# Patient Record
Sex: Female | Born: 1955 | Race: Asian | Hispanic: No | Marital: Married | Smoking: Never smoker
Health system: Southern US, Community
[De-identification: ages and names within clinical notes are randomized; demographics above are authoritative.]

## PROBLEM LIST (undated history)

## (undated) DIAGNOSIS — I10 Essential (primary) hypertension: Secondary | ICD-10-CM

## (undated) DIAGNOSIS — E119 Type 2 diabetes mellitus without complications: Secondary | ICD-10-CM

---

## 1999-02-28 ENCOUNTER — Other Ambulatory Visit: Admission: RE | Admit: 1999-02-28 | Discharge: 1999-02-28 | Payer: Self-pay | Admitting: Family Medicine

## 1999-03-06 ENCOUNTER — Encounter: Admission: RE | Admit: 1999-03-06 | Discharge: 1999-03-06 | Payer: Self-pay | Admitting: Obstetrics & Gynecology

## 1999-03-23 ENCOUNTER — Ambulatory Visit (HOSPITAL_COMMUNITY): Admission: RE | Admit: 1999-03-23 | Discharge: 1999-03-23 | Payer: Self-pay | Admitting: Obstetrics & Gynecology

## 2007-02-10 ENCOUNTER — Ambulatory Visit (HOSPITAL_BASED_OUTPATIENT_CLINIC_OR_DEPARTMENT_OTHER): Admission: RE | Admit: 2007-02-10 | Discharge: 2007-02-10 | Payer: Self-pay | Admitting: Orthopedic Surgery

## 2007-02-10 ENCOUNTER — Encounter (INDEPENDENT_AMBULATORY_CARE_PROVIDER_SITE_OTHER): Payer: Self-pay | Admitting: Orthopedic Surgery

## 2011-01-01 NOTE — Op Note (Signed)
NAME:  Patricia Petty, Patricia Petty                      ACCOUNT NO.:  [id number]   MEDICAL RECORD NO.:  [id number]          PATIENT TYPE:  AMB   LOCATION:  DSC                          FACILITY:  [facility]   PHYSICIAN:  Cindee Salt, M.D.       DATE OF BIRTH:  06-Jun-1956   DATE OF PROCEDURE:  02/10/2007  DATE OF DISCHARGE:                               OPERATIVE REPORT   PREOPERATIVE DIAGNOSIS:  Cyst of first dorsal compartment, left wrist,  with de Quervain's.   POSTOPERATIVE DIAGNOSIS:  Cyst of first dorsal compartment, left wrist,  with de Quervain's.   OPERATION:  Release of first dorsal compartment and excision of first  dorsal compartment cyst, left wrist.   SURGEON:  Cindee Salt, M.D.   ASSISTANT:  None.   ANESTHESIA:  Forearm-based IV regional.   HISTORY:  The patient is a 55 year old female with a history of large  mass on the radial aspect of her wrist with positive Finkelstein test.  She is desirous of release and excision of the mass.  She is aware of  risks and complications including infection, recurrence, injury to  arteries, nerves and tendons, incomplete relief of symptoms, dystrophy,  possibility of irritation or injury to the radial nerve with numbness  and tingling distally.  She is desirous of removal.  Questions were  encouraged and answered.  In the preoperative area, the extremity was  marked by both patient and surgeon with an interpreter.  Questions again  answered for her to her satisfaction and antibiotic given.   PROCEDURE:  The patient was brought to the operating room where a  forearm-based IV regional anesthetic was carried out without difficulty.  She was prepped using Betadine scrubbing solution with the left arm  free.  An oblique incision was made in line with the tendons and carried  down through subcutaneous tissue.  Bleeders were electrocauterized.  Radial nerve was identified and protected.  A large cystic mass was  immediately apparent, occupying the central  portion of the 1st dorsal  compartment.  The extensor pollicis brevis tendon was identified.  To  the dorsal aspect of the 1st dorsal compartment, incision was made.  The  septum was present; this was excised.  A release was performed.  A  significant synovitis was present and synovectomy performed.  The APL  was also identified and synovectomy performed of this.  The cyst was  then excised, taking care to preserve the retinaculum; this was sent to  Pathology.  The wound was irrigated.  The thumb lay in good position  with the tendons within the sheath.  The sheath was maintained volarly.  The skin was then closed with interrupted 4-0 Vicryl Rapide sutures.  A  sterile compressive dressing and thumb spica splint were applied.  The  patient tolerated the procedure well and was taken to the recovery room  for observation in satisfactory condition.  She will be discharged home  to return to the [facility] [place] in 1 week on Vicodin.  ______________________________  Cindee Salt, M.D.    GK/MEDQ  D:  02/10/2007  T:  02/11/2007  Job:  [id number]

## 2011-06-05 LAB — POCT HEMOGLOBIN-HEMACUE
Hemoglobin: 16.2 — ABNORMAL HIGH
Operator id: 116011

## 2017-05-18 ENCOUNTER — Encounter (HOSPITAL_COMMUNITY): Payer: Self-pay | Admitting: Emergency Medicine

## 2017-05-18 DIAGNOSIS — R1031 Right lower quadrant pain: Secondary | ICD-10-CM | POA: Diagnosis not present

## 2017-05-18 DIAGNOSIS — N10 Acute tubulo-interstitial nephritis: Secondary | ICD-10-CM | POA: Insufficient documentation

## 2017-05-18 DIAGNOSIS — E119 Type 2 diabetes mellitus without complications: Secondary | ICD-10-CM | POA: Diagnosis not present

## 2017-05-18 DIAGNOSIS — I10 Essential (primary) hypertension: Secondary | ICD-10-CM | POA: Insufficient documentation

## 2017-05-18 DIAGNOSIS — Z79899 Other long term (current) drug therapy: Secondary | ICD-10-CM | POA: Insufficient documentation

## 2017-05-18 DIAGNOSIS — Z7984 Long term (current) use of oral hypoglycemic drugs: Secondary | ICD-10-CM | POA: Diagnosis not present

## 2017-05-18 DIAGNOSIS — R112 Nausea with vomiting, unspecified: Secondary | ICD-10-CM | POA: Diagnosis present

## 2017-05-18 LAB — CBC
HEMATOCRIT: 39 % (ref 36.0–46.0)
Hemoglobin: 13.3 g/dL (ref 12.0–15.0)
MCH: 30.5 pg (ref 26.0–34.0)
MCHC: 34.1 g/dL (ref 30.0–36.0)
MCV: 89.4 fL (ref 78.0–100.0)
Platelets: 258 10*3/uL (ref 150–400)
RBC: 4.36 MIL/uL (ref 3.87–5.11)
RDW: 13.2 % (ref 11.5–15.5)
WBC: 7.9 10*3/uL (ref 4.0–10.5)

## 2017-05-18 LAB — COMPREHENSIVE METABOLIC PANEL
ALT: 35 U/L (ref 14–54)
ANION GAP: 8 (ref 5–15)
AST: 36 U/L (ref 15–41)
Albumin: 4 g/dL (ref 3.5–5.0)
Alkaline Phosphatase: 72 U/L (ref 38–126)
BUN: 7 mg/dL (ref 6–20)
CO2: 25 mmol/L (ref 22–32)
Calcium: 9.1 mg/dL (ref 8.9–10.3)
Chloride: 106 mmol/L (ref 101–111)
Creatinine, Ser: 0.76 mg/dL (ref 0.44–1.00)
Glucose, Bld: 112 mg/dL — ABNORMAL HIGH (ref 65–99)
POTASSIUM: 3.5 mmol/L (ref 3.5–5.1)
Sodium: 139 mmol/L (ref 135–145)
TOTAL PROTEIN: 7.2 g/dL (ref 6.5–8.1)
Total Bilirubin: 1.8 mg/dL — ABNORMAL HIGH (ref 0.3–1.2)

## 2017-05-18 LAB — LIPASE, BLOOD: Lipase: 32 U/L (ref 11–51)

## 2017-05-18 NOTE — ED Triage Notes (Signed)
BIB EMS from home for N/V/D. Pt speaks vietnamese. 22 ga IV left hand.  EMS VS 104/56, HR 89, CBG 128, RR 22.

## 2017-05-19 ENCOUNTER — Emergency Department (HOSPITAL_COMMUNITY)
Admission: EM | Admit: 2017-05-19 | Discharge: 2017-05-19 | Disposition: A | Discharge status: Home / Self Care | Payer: BLUE CROSS/BLUE SHIELD | Attending: Emergency Medicine | Admitting: Emergency Medicine

## 2017-05-19 ENCOUNTER — Encounter (HOSPITAL_COMMUNITY): Payer: Self-pay | Admitting: Radiology

## 2017-05-19 ENCOUNTER — Emergency Department (HOSPITAL_COMMUNITY): Payer: BLUE CROSS/BLUE SHIELD

## 2017-05-19 DIAGNOSIS — N12 Tubulo-interstitial nephritis, not specified as acute or chronic: Secondary | ICD-10-CM

## 2017-05-19 HISTORY — DX: Essential (primary) hypertension: I10

## 2017-05-19 HISTORY — DX: Type 2 diabetes mellitus without complications: E11.9

## 2017-05-19 LAB — URINALYSIS, ROUTINE W REFLEX MICROSCOPIC
BILIRUBIN URINE: NEGATIVE
Glucose, UA: NEGATIVE mg/dL
Hgb urine dipstick: NEGATIVE
KETONES UR: NEGATIVE mg/dL
Nitrite: NEGATIVE
PROTEIN: NEGATIVE mg/dL
Specific Gravity, Urine: 1.012 (ref 1.005–1.030)
pH: 6 (ref 5.0–8.0)

## 2017-05-19 LAB — CBG MONITORING, ED: Glucose-Capillary: 125 mg/dL — ABNORMAL HIGH (ref 65–99)

## 2017-05-19 MED ORDER — ONDANSETRON HCL 4 MG/2ML IJ SOLN
4.0000 mg | Freq: Once | INTRAMUSCULAR | Status: AC
  Administered 2017-05-19: 4 mg via INTRAVENOUS
  Filled 2017-05-19: qty 2

## 2017-05-19 MED ORDER — CEPHALEXIN 500 MG PO CAPS: 500.0000 mg | ORAL_CAPSULE | Freq: Three times a day (TID) | ORAL | 0 refills | Status: DC

## 2017-05-19 MED ORDER — SODIUM CHLORIDE 0.9 % IV BOLUS (SEPSIS)
1000.0000 mL | Freq: Once | INTRAVENOUS | Status: AC
  Administered 2017-05-19: 1000 mL via INTRAVENOUS

## 2017-05-19 MED ORDER — DEXTROSE 5 % IV SOLN
1.0000 g | Freq: Once | INTRAVENOUS | Status: AC
  Administered 2017-05-19: 1 g via INTRAVENOUS
  Filled 2017-05-19: qty 10

## 2017-05-19 MED ORDER — ONDANSETRON 4 MG PO TBDP: ORAL_TABLET | ORAL | 0 refills | Status: DC

## 2017-05-19 NOTE — ED Provider Notes (Signed)
[facility]-EMERGENCY DEPT Provider Note   CSN: [id number] Arrival date & time: 05/18/17  2257     History   Chief Complaint Chief Complaint  Patient presents with  . Nausea  . Emesis    HPI Patricia Petty is a 61 y.o. female history of diabetes, hypertension here presenting with nausea, vomiting, flank pain. Patient states that she drinks some cactus juice yesterday as a home remedy for her diabetes. Shortly afterwards, she began having nausea and vomiting and right flank pain. She states that her blood pressure was low at the time but denies passing out or chest pain. Patient states that she has history of diabetes but is not currently on medicines for it. Denies fever or diarrhea. Has family hx of kidney stones but no personal history. Denies sick contacts.   The history is provided by the patient.    Past Medical History:  Diagnosis Date  . Diabetes mellitus without complication (HCC)   . Hypertension     There are no active problems to display for this patient.   History reviewed. No pertinent surgical history.  OB History    No data available       Home Medications    Prior to Admission medications   Medication Sig Start Date End Date Taking? Authorizing Provider  atorvastatin (LIPITOR) 40 MG tablet Take 40 mg by mouth daily. 10/21/16 10/21/17 Yes [provider]  losartan (COZAAR) 25 MG tablet Take 25 mg by mouth daily. 10/21/16 10/21/17 Yes [provider]  metFORMIN (GLUCOPHAGE-XR) 500 MG 24 hr tablet Take 1,000 mg by mouth daily. 02/03/17  Yes [provider]  spironolactone (ALDACTONE) 50 MG tablet Take 50 mg by mouth daily. 02/03/17 02/03/18 Yes [provider]    Family History No family history on file.  Social History Social History  Substance Use Topics  . Smoking status: Not on file  . Smokeless tobacco: Not on file  . Alcohol use Not on file     Allergies   Patient has no known allergies.   Review of Systems Review of  Systems  Gastrointestinal: Positive for vomiting.  Genitourinary: Positive for flank pain.  All other systems reviewed and are negative.    Physical Exam Updated Vital Signs BP 116/68 (BP Location: Right Arm)   Pulse 87   Temp 98.6 F (37 C) (Oral)   Resp 16   Ht  (1.6 m)   Wt 70.3 kg (155 lb)   SpO2 95%   BMI 27.46 kg/m   Physical Exam  Constitutional: She is oriented to person, place, and time. She appears well-developed and well-nourished.  Slightly dehydrated   HENT:  Head: Normocephalic.  MM slightly dry   Eyes: Pupils are equal, round, and reactive to light. Conjunctivae and EOM are normal.  Neck: Normal range of motion. Neck supple.  Cardiovascular: Normal rate, regular rhythm and normal heart sounds.   Pulmonary/Chest: Effort normal and breath sounds normal. No respiratory distress. She has no wheezes. She has no rales.  Abdominal: Soft. Bowel sounds are normal.  Mild L CVAT   Musculoskeletal: Normal range of motion. She exhibits no edema.  Neurological: She is alert and oriented to person, place, and time. No cranial nerve deficit. Coordination normal.  Skin: Skin is warm.  Psychiatric: She has a normal mood and affect.  Nursing note and vitals reviewed.    ED Treatments / Results  Labs (all labs ordered are listed, but only abnormal results are displayed) Labs Reviewed  COMPREHENSIVE METABOLIC PANEL - Abnormal; Notable for the following:       Result Value   Glucose, Bld 112 (*)    Total Bilirubin 1.8 (*)    All other components within normal limits  URINALYSIS, ROUTINE W REFLEX MICROSCOPIC - Abnormal; Notable for the following:    APPearance HAZY (*)    Leukocytes, UA MODERATE (*)    Bacteria, UA RARE (*)    Squamous Epithelial / LPF 0-5 (*)    All other components within normal limits  CBG MONITORING, ED - Abnormal; Notable for the following:    Glucose-Capillary 125 (*)    All other components within normal limits  URINE CULTURE  LIPASE,  BLOOD  CBC    EKG  EKG Interpretation None       Radiology Ct Renal Stone Study  Result Date: 05/19/2017 CLINICAL DATA:  Right lower back and abdominal pain. Assess for kidney stone. EXAM: CT ABDOMEN AND PELVIS WITHOUT CONTRAST TECHNIQUE: Multidetector CT imaging of the abdomen and pelvis was performed following the standard protocol without IV contrast. COMPARISON:  None. FINDINGS: Lower chest: Mild chronic appearing volume loss in the left lower lobe. Hepatobiliary: Small calcified stones dependent within the gallbladder. No CT evidence of cholecystitis or obstruction. I do not see evidence of a passing stone. Nonspecific 17 mm low-density at the dome of the liver. Presumed hemangioma or cyst in the absence of any clinical history of malignancy. Pancreas: Normal Spleen: Normal Adrenals/Urinary Tract: Adrenal glands are normal. Left kidney is normal. Small vascular calcification in the hilum. Right kidney shows 2 probable cysts in the upper pole. Tiny calcifications in the hilar region presumed to be vascular. No sign of hydroureteronephrosis. No evidence passing stone. No stone in the bladder. Stomach/Bowel: No visible bowel pathology.  Appendix is normal. Vascular/Lymphatic: Aortic atherosclerosis. No aneurysm. Aortic branch vessel atherosclerosis. Reproductive: Uterus and adnexal regions appear normal. Other: No free fluid or air. Musculoskeletal: Ordinary mild lumbar degenerative changes. IMPRESSION: No cause of acute pain is identified. No evidence of urinary tract stone disease. Small calcifications in the renal hilar regions are probably vascular. Two small low densities in the upper pole the right kidney presumed to represent cysts. Small gallstones layering dependent in the gallbladder. No CT evidence of cholecystitis or obstruction. Consider ultrasound if there is concern regarding that. Aortic atherosclerosis. Electronically Signed   By: Paulina Fusi M.D.   On: 05/19/2017 11:47     Procedures Procedures (including critical care time)  Medications Ordered in ED Medications  sodium chloride 0.9 % bolus 1,000 mL (0 mLs Intravenous Stopped 05/19/17 1021)  ondansetron (ZOFRAN) injection 4 mg (4 mg Intravenous Given 05/19/17 0914)  cefTRIAXone (ROCEPHIN) 1 g in dextrose 5 % 50 mL IVPB (0 g Intravenous Stopped 05/19/17 0943)     Initial Impression / Assessment and Plan / ED Course  I have reviewed the triage vital signs and the nursing notes.  Pertinent labs & imaging results that were available during my care of the patient were reviewed by me and considered in my medical decision making (see chart for details).     Patricia Petty is a 61 y.o. female here with R flank pain, vomiting. Afebrile, appears mildly dehydrated. Consider renal colic vs pyelo vs gastro. Will get labs, CT renal stone, UA.   11:58 AM WBC nl. UA + UTI. CT renal stone showed no obvious stone or hydro. Given rocephin. Doesn't appear septic. Will dc home with keflex for a week for possible pyelo.  Final Clinical Impressions(s) / ED Diagnoses   Final diagnoses:  None    New Prescriptions New Prescriptions   No medications on file     Charlynne Pander, MD 05/19/17 1159

## 2017-05-19 NOTE — Discharge Instructions (Signed)
Take keflex three times daily for a week.   Take zofran as needed for nausea.   Stay hydrated.   Avoid cactus juice for now.   See your doctor  Return to ER if you have worse vomiting, flank pain, fever.

## 2017-05-19 NOTE — ED Notes (Signed)
Patient transported to CT 

## 2017-05-20 LAB — URINE CULTURE

## 2019-08-08 IMAGING — CT CT RENAL STONE PROTOCOL
2 of 3 series · 16 of 46 positions shown, 18 images · non-contrast
Comparison: None.

CLINICAL DATA: Right lower back and abdominal pain. Assess for
kidney stone.

EXAM:
CT ABDOMEN AND PELVIS WITHOUT CONTRAST
TECHNIQUE: Multidetector CT imaging of the abdomen and pelvis was performed
following the standard protocol without IV contrast.

[Series 3: coronal · coronal · 0.74mm/px · 3 of 114 slices shown]
[im 38/114  soft-tissue]
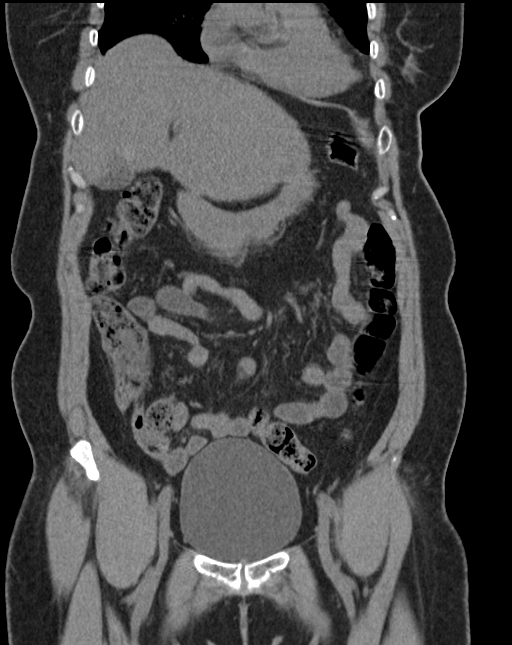
[im 51/114  soft-tissue]
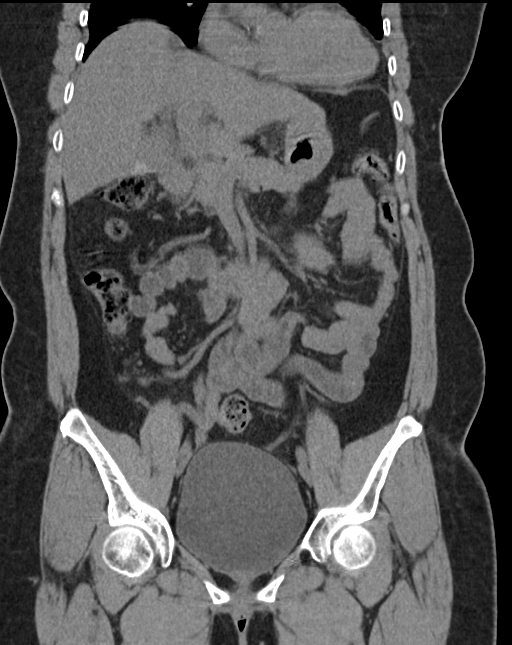
[im 63/114  soft-tissue]
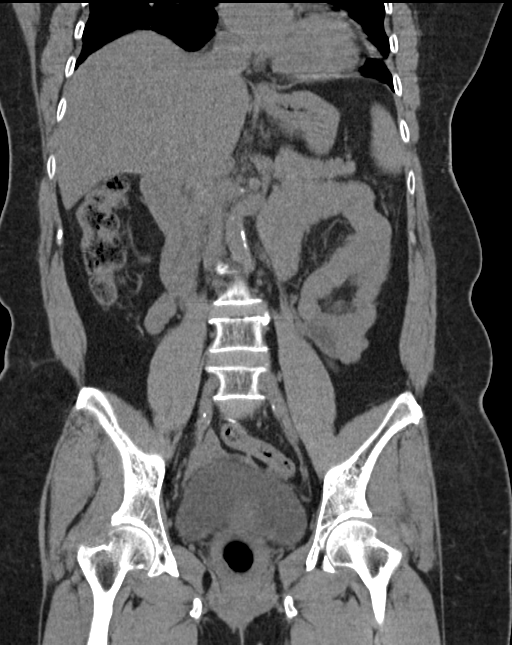

[Series 6: lung · axial · 0.78mm/px · z∈[-108,-16]mm · 13 of 54 slices shown, 15 images]
[im 4/54  soft-tissue]
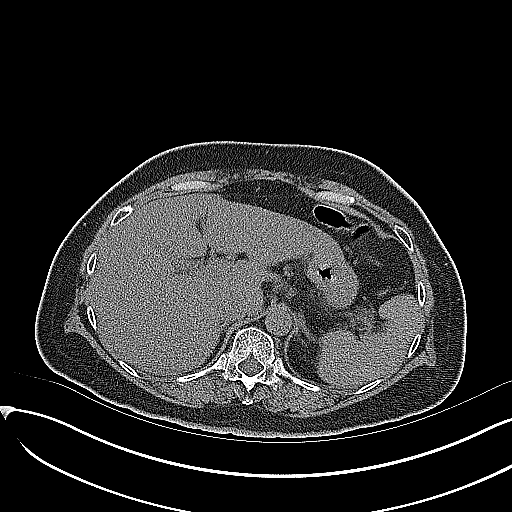
[im 4/54  bone]
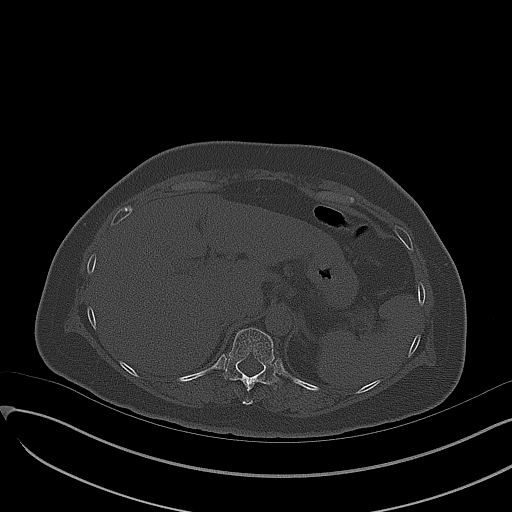
[im 7/54  soft-tissue]
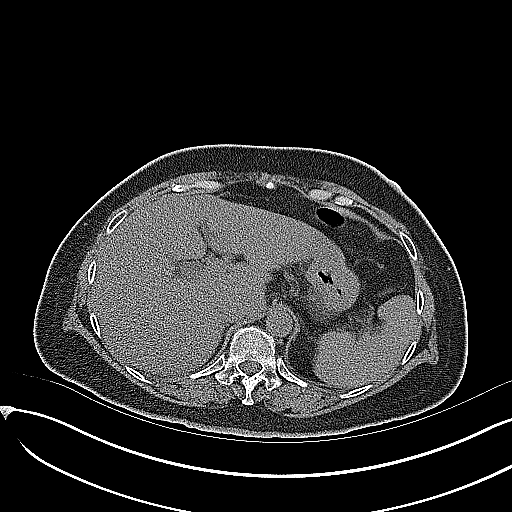
[im 11/54  soft-tissue]
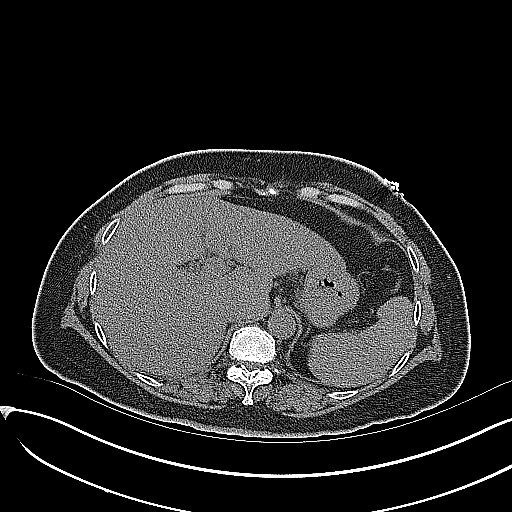
[im 16/54  soft-tissue]
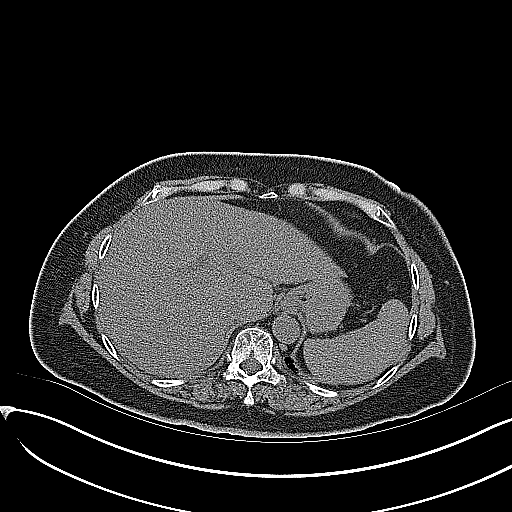
[im 19/54  soft-tissue]
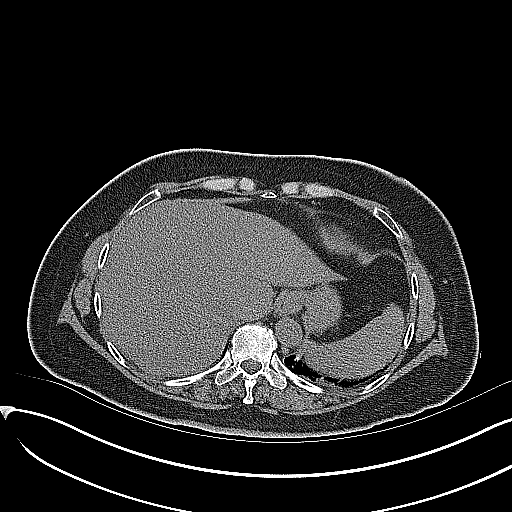
[im 23/54  soft-tissue]
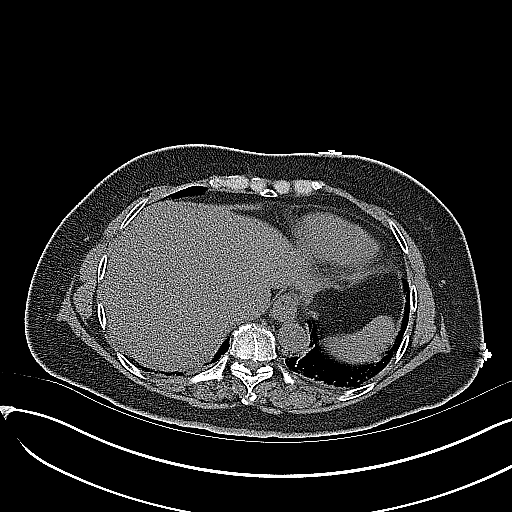
[im 28/54  soft-tissue]
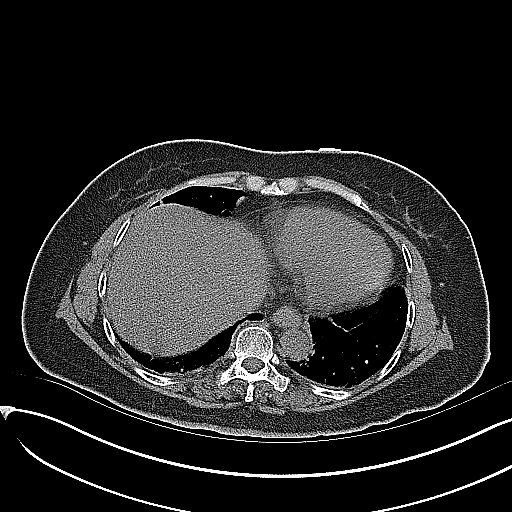
[im 31/54  soft-tissue]
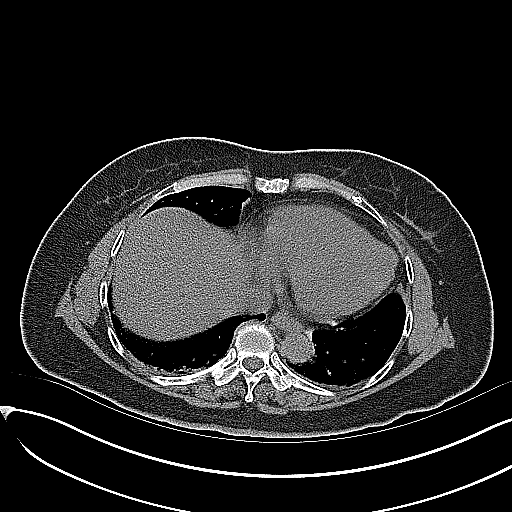
[im 35/54  soft-tissue]
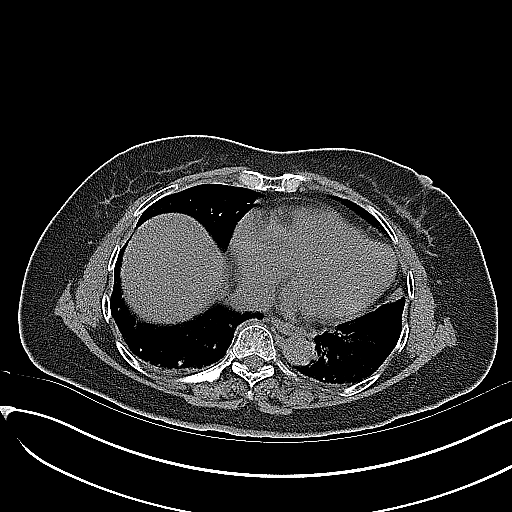
[im 35/54  bone]
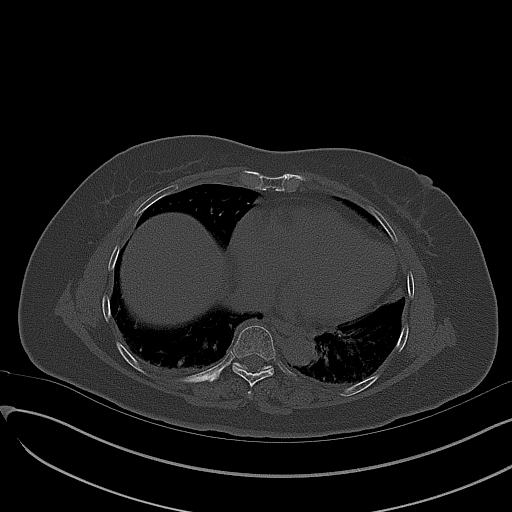
[im 38/54  soft-tissue]
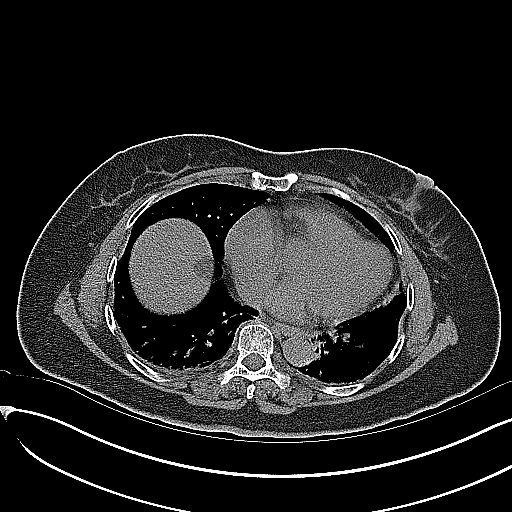
[im 43/54  soft-tissue]
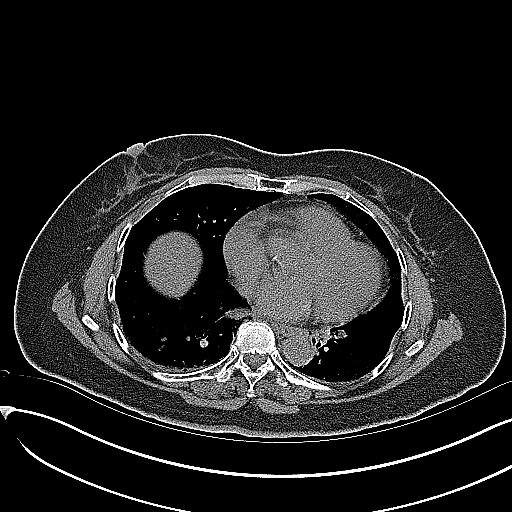
[im 47/54  soft-tissue]
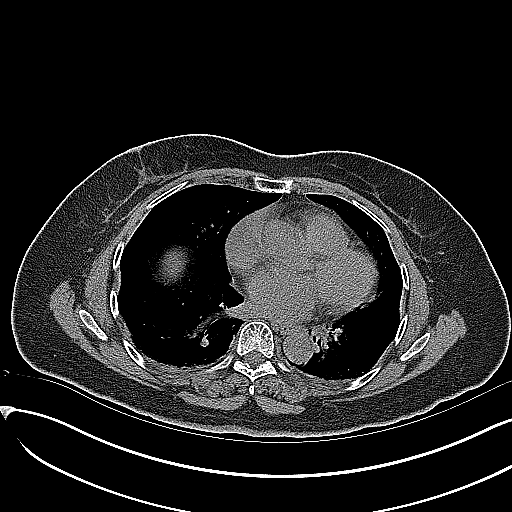
[im 50/54  soft-tissue]
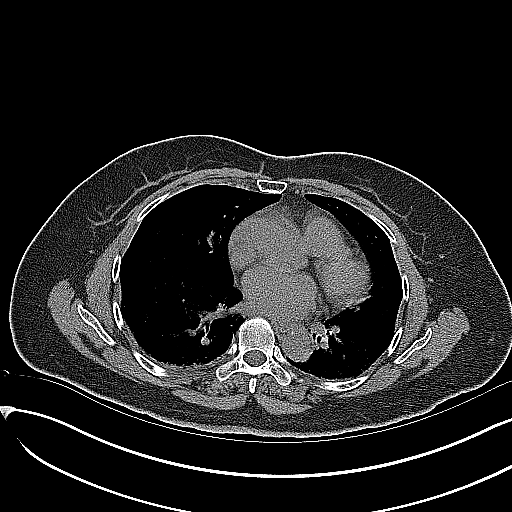

[16 of 46 positions shown; findings below may reference images not displayed]

FINDINGS: Lower chest: Mild chronic appearing volume loss in the left lower
lobe.

Hepatobiliary: Small calcified stones dependent within the
gallbladder. No CT evidence of cholecystitis or obstruction. I do
not see evidence of a passing stone. Nonspecific 17 mm low-density
at the dome of the liver. Presumed hemangioma or cyst in the absence
of any clinical history of malignancy.

Pancreas: Normal

Spleen: Normal

Adrenals/Urinary Tract: Adrenal glands are normal. Left kidney is
normal. Small vascular calcification in the hilum. Right kidney
shows 2 probable cysts in the upper pole. Tiny calcifications in the
hilar region presumed to be vascular. No sign of
hydroureteronephrosis. No evidence passing stone. No stone in the
bladder.

Stomach/Bowel: No visible bowel pathology.  Appendix is normal.

Vascular/Lymphatic: Aortic atherosclerosis. No aneurysm. Aortic
branch vessel atherosclerosis.

Reproductive: Uterus and adnexal regions appear normal.

Other: No free fluid or air.

Musculoskeletal: Ordinary mild lumbar degenerative changes.
IMPRESSION: No cause of acute pain is identified. No evidence of urinary tract
stone disease. Small calcifications in the renal hilar regions are
probably vascular. Two small low densities in the upper pole the
right kidney presumed to represent cysts.

Small gallstones layering dependent in the gallbladder. No CT
evidence of cholecystitis or obstruction. Consider ultrasound if
there is concern regarding that.

Aortic atherosclerosis.

## 2021-03-16 ENCOUNTER — Ambulatory Visit
Admission: EM | Admit: 2021-03-16 | Discharge: 2021-03-16 | Disposition: A | Discharge status: Home / Self Care | Payer: 59 | Attending: Physician Assistant | Admitting: Physician Assistant

## 2021-03-16 ENCOUNTER — Other Ambulatory Visit: Payer: Self-pay

## 2021-03-16 DIAGNOSIS — R52 Pain, unspecified: Secondary | ICD-10-CM

## 2021-03-16 DIAGNOSIS — J069 Acute upper respiratory infection, unspecified: Secondary | ICD-10-CM | POA: Diagnosis not present

## 2021-03-16 MED ORDER — BENZONATATE 100 MG PO CAPS: 100.0000 mg | ORAL_CAPSULE | Freq: Three times a day (TID) | ORAL | 0 refills | Status: AC

## 2021-03-16 NOTE — ED Triage Notes (Signed)
Pt c/o sore throat that began last wed. Stated she gargled salt water which offered relief. Now pt has new onset sore throat and body aches and she is concerned for covid.

## 2021-03-16 NOTE — ED Provider Notes (Signed)
[facility]    CSN: [id number] Arrival date & time: 03/16/21  1634      History   Chief Complaint Chief Complaint  Patient presents with   Cough    HPI Patricia Petty is a 65 y.o. female.   Patient presents today accompanied by her daughter who provides majority of history.  Reports a 2-day history of URI symptoms including sore throat, cough, nasal congestion, body aches.  Denies any chest pain, shortness of breath, fever, nausea, vomiting.  Reports household sick contacts with similar symptoms.  Reports that she has been taking Tylenol without improvement of symptoms.  Denies any recent antibiotic use.  She does have a history of diabetes but reports blood sugars are adequately controlled.  She denies any history of chronic liver/kidney disease, malignancy, immunosuppression, chronic lung disease.  She has had COVID-19 vaccinations and boosters.  Reports she is able to perform daily activities despite symptoms.   Past Medical History:  Diagnosis Date   Diabetes mellitus without complication (HCC)    Hypertension     There are no problems to display for this patient.   History reviewed. No pertinent surgical history.  OB History   No obstetric history on file.      Home Medications    Prior to Admission medications   Medication Sig Start Date End Date Taking? Authorizing Provider  benzonatate (TESSALON) 100 MG capsule Take 1 capsule (100 mg total) by mouth every 8 (eight) hours. 03/16/21  Yes Zeppelin Commisso K, PA-C  atorvastatin (LIPITOR) 40 MG tablet Take 40 mg by mouth daily. 10/21/16 10/21/17  [provider]  losartan (COZAAR) 25 MG tablet Take 25 mg by mouth daily. 10/21/16 10/21/17  [provider]  metFORMIN (GLUCOPHAGE-XR) 500 MG 24 hr tablet Take 1,000 mg by mouth daily. 02/03/17   [provider]  spironolactone (ALDACTONE) 50 MG tablet Take 50 mg by mouth daily. 02/03/17 02/03/18  [provider]    Family History History  reviewed. No pertinent family history.  Social History Social History   Tobacco Use   Smoking status: Never   Smokeless tobacco: Never     Allergies   Patient has no known allergies.   Review of Systems Review of Systems  Constitutional:  Positive for activity change. Negative for appetite change, fatigue and fever.  HENT:  Positive for congestion and sore throat. Negative for sinus pressure and sneezing.   Respiratory:  Positive for cough. Negative for shortness of breath.   Cardiovascular:  Negative for chest pain.  Gastrointestinal:  Negative for abdominal pain, diarrhea, nausea and vomiting.  Musculoskeletal:  Positive for arthralgias and myalgias.  Neurological:  Positive for headaches. Negative for dizziness and light-headedness.    Physical Exam Triage Vital Signs ED Triage Vitals [03/16/21 1721]  Enc Vitals Group     BP (!) 145/82     Pulse Rate 91     Resp 18     Temp 98.7 F (37.1 C)     Temp Source Oral     SpO2 97 %     Weight      Height      Head Circumference      Peak Flow      Pain Score 5     Pain Loc      Pain Edu?      Excl. in GC?    No data found.  Updated Vital Signs BP (!) 145/82 (BP Location: Left Arm)   Pulse 91  Temp 98.7 F (37.1 C) (Oral)   Resp 18   SpO2 97%   Visual Acuity Right Eye Distance:   Left Eye Distance:   Bilateral Distance:    Right Eye Near:   Left Eye Near:    Bilateral Near:     Physical Exam Vitals reviewed.  Constitutional:      General: She is awake. She is not in acute distress.    Appearance: Normal appearance. She is normal weight. She is not ill-appearing.     Comments: Very pleasant female appears stated age in no acute distress  HENT:     Head: Normocephalic and atraumatic.     Right Ear: Tympanic membrane, ear canal and external ear normal. Tympanic membrane is not erythematous or bulging.     Left Ear: Tympanic membrane, ear canal and external ear normal. Tympanic membrane is not  erythematous or bulging.     Nose:     Right Sinus: No maxillary sinus tenderness or frontal sinus tenderness.     Left Sinus: No maxillary sinus tenderness or frontal sinus tenderness.     Mouth/Throat:     Pharynx: Uvula midline. Posterior oropharyngeal erythema present. No oropharyngeal exudate.  Cardiovascular:     Rate and Rhythm: Normal rate and regular rhythm.     Heart sounds: Normal heart sounds, S1 normal and S2 normal. No murmur heard. Pulmonary:     Effort: Pulmonary effort is normal.     Breath sounds: Normal breath sounds. No wheezing, rhonchi or rales.     Comments: Clear to auscultation bilaterally Lymphadenopathy:     Head:     Right side of head: No submental, submandibular or tonsillar adenopathy.     Left side of head: No submental, submandibular or tonsillar adenopathy.     Cervical: No cervical adenopathy.  Psychiatric:        Behavior: Behavior is cooperative.     UC Treatments / Results  Labs (all labs ordered are listed, but only abnormal results are displayed) Labs Reviewed  COVID-19, FLU A+B NAA    EKG   Radiology No results found.  Procedures Procedures (including critical care time)  Medications Ordered in UC Medications - No data to display  Initial Impression / Assessment and Plan / UC Course  I have reviewed the triage vital signs and the nursing notes.  Pertinent labs & imaging results that were available during my care of the patient were reviewed by me and considered in my medical decision making (see chart for details).      COVID and flu test pending today.  Discussed likely viral etiology given short duration of symptoms.  Patient is candidate for oral antivirals given age and history of diabetes.  Will consider molnupiravir if positive.  She was prescribed Tessalon for cough.  She was encouraged to use over-the-counter medications including Tylenol, Mucinex, Flonase for symptom relief.  Discussed alarm symptoms that warrant  emergent evaluation.  Strict return precautions given to which patient expressed understanding.  Final Clinical Impressions(s) / UC Diagnoses   Final diagnoses:  URI with cough and congestion  Body aches     Discharge Instructions      We will contact you if your COVID results are positive.  If you are interested in oral antivirals please let our nurse know when they call you with your results so we can help arrange this.  Use Tessalon up to 3 times a day as needed for cough.  Use over-the-counter medications including Mucinex and  Flonase for symptom relief.  Make sure you are drinking plenty of fluid.  If you have any shortness of breath, chest pain, nausea, vomiting you need to go to the emergency room for further evaluation.     ED Prescriptions     Medication Sig Dispense Auth. Provider   benzonatate (TESSALON) 100 MG capsule Take 1 capsule (100 mg total) by mouth every 8 (eight) hours. 21 capsule Mindi Akerson K, PA-C      PDMP not reviewed this encounter.   Jeani Hawking, PA-C 03/16/21 1743

## 2021-03-16 NOTE — Discharge Instructions (Addendum)
We will contact you if your COVID results are positive.  If you are interested in oral antivirals please let our nurse know when they call you with your results so we can help arrange this.  Use Tessalon up to 3 times a day as needed for cough.  Use over-the-counter medications including Mucinex and Flonase for symptom relief.  Make sure you are drinking plenty of fluid.  If you have any shortness of breath, chest pain, nausea, vomiting you need to go to the emergency room for further evaluation.

## 2021-03-17 LAB — COVID-19, FLU A+B NAA
Influenza A, NAA: NOT DETECTED
Influenza B, NAA: NOT DETECTED
SARS-CoV-2, NAA: DETECTED — AB

## 2021-05-02 ENCOUNTER — Encounter: Payer: Self-pay | Admitting: Emergency Medicine

## 2021-05-02 ENCOUNTER — Other Ambulatory Visit: Payer: Self-pay

## 2021-05-02 ENCOUNTER — Ambulatory Visit
Admission: EM | Admit: 2021-05-02 | Discharge: 2021-05-02 | Disposition: A | Discharge status: Home / Self Care | Payer: 59 | Attending: Internal Medicine | Admitting: Internal Medicine

## 2021-05-02 DIAGNOSIS — N3000 Acute cystitis without hematuria: Secondary | ICD-10-CM | POA: Diagnosis not present

## 2021-05-02 DIAGNOSIS — R350 Frequency of micturition: Secondary | ICD-10-CM | POA: Insufficient documentation

## 2021-05-02 DIAGNOSIS — R300 Dysuria: Secondary | ICD-10-CM | POA: Insufficient documentation

## 2021-05-02 LAB — POCT URINALYSIS DIP (MANUAL ENTRY)
Bilirubin, UA: NEGATIVE
Blood, UA: NEGATIVE
Glucose, UA: NEGATIVE mg/dL
Ketones, POC UA: NEGATIVE mg/dL
Nitrite, UA: NEGATIVE
Protein Ur, POC: NEGATIVE mg/dL
Spec Grav, UA: 1.015 (ref 1.010–1.025)
Urobilinogen, UA: 0.2 E.U./dL
pH, UA: 6.5 (ref 5.0–8.0)

## 2021-05-02 MED ORDER — NITROFURANTOIN MONOHYD MACRO 100 MG PO CAPS: 100.0000 mg | ORAL_CAPSULE | Freq: Two times a day (BID) | ORAL | 0 refills | Status: AC

## 2021-05-02 NOTE — ED Triage Notes (Signed)
Patient's daughter c/o possible UTI, low back pain and abdomen pain x 2 days.  Dysuria, urgency and frequency.  No hematuria.

## 2021-05-02 NOTE — ED Provider Notes (Signed)
[facility]    CSN: [id number] Arrival date & time: 05/02/21  0858      History   Chief Complaint Chief Complaint  Patient presents with   Possible UTI    HPI Patricia Petty is a 65 y.o. female.   Patient presents with bilateral lower back pain, abdominal pain, urinary burning, urinary frequency that has been present for approximately 2 days.  Denies any pelvic pain, hematuria, vaginal discharge, fever.  Daughter states that patient had a urinary tract infection a few months prior that was treated successfully.    Past Medical History:  Diagnosis Date   Diabetes mellitus without complication (HCC)    Hypertension     There are no problems to display for this patient.   History reviewed. No pertinent surgical history.  OB History   No obstetric history on file.      Home Medications    Prior to Admission medications   Medication Sig Start Date End Date Taking? Authorizing Provider  atorvastatin (LIPITOR) 40 MG tablet Take by mouth. 06/16/19  Yes [provider]  glipiZIDE (GLUCOTROL XL) 5 MG 24 hr tablet Take 1 tablet by mouth daily. 06/16/19  Yes [provider]  losartan (COZAAR) 25 MG tablet Take by mouth. 06/16/19  Yes [provider]  metFORMIN (GLUCOPHAGE-XR) 500 MG 24 hr tablet Take 1,000 mg by mouth daily. 02/03/17  Yes [provider]  nitrofurantoin, macrocrystal-monohydrate, (MACROBID) 100 MG capsule Take 1 capsule (100 mg total) by mouth 2 (two) times daily for 7 days. 05/02/21 05/09/21 Yes Lance Muss, FNP  atorvastatin (LIPITOR) 40 MG tablet Take 40 mg by mouth daily. 10/21/16 10/21/17  [provider]  benzonatate (TESSALON) 100 MG capsule Take 1 capsule (100 mg total) by mouth every 8 (eight) hours. 03/16/21   Raspet, Noberto Retort, PA-C  losartan (COZAAR) 25 MG tablet Take 25 mg by mouth daily. 10/21/16 10/21/17  [provider]  spironolactone (ALDACTONE) 50 MG tablet Take 50 mg by mouth daily.  02/03/17 02/03/18  [provider]    Family History No family history on file.  Social History Social History   Tobacco Use   Smoking status: Never   Smokeless tobacco: Never     Allergies   Patient has no known allergies.   Review of Systems Review of Systems Per HPI  Physical Exam Triage Vital Signs ED Triage Vitals  Enc Vitals Group     BP 05/02/21 1027 138/88     Pulse Rate 05/02/21 1027 64     Resp 05/02/21 1046 18     Temp 05/02/21 1027 98.1 F (36.7 C)     Temp Source 05/02/21 1027 Oral     SpO2 05/02/21 1027 98 %     Weight 05/02/21 1028 150 lb (68 kg)     Height 05/02/21 1028 5\' 2"  (1.575 m)     Head Circumference --      Peak Flow --      Pain Score 05/02/21 1027 8     Pain Loc --      Pain Edu? --      Excl. in GC? --    No data found.  Updated Vital Signs BP 138/88 (BP Location: Left Arm)   Pulse 64   Temp 98.1 F (36.7 C) (Oral)   Resp 18   Ht 5\' 2"  (1.575 m)   Wt 150 lb (68 kg)   SpO2 98%   BMI 27.44 kg/m   Visual  Acuity Right Eye Distance:   Left Eye Distance:   Bilateral Distance:    Right Eye Near:   Left Eye Near:    Bilateral Near:     Physical Exam Constitutional:      Appearance: Normal appearance.  HENT:     Head: Normocephalic and atraumatic.  Eyes:     Extraocular Movements: Extraocular movements intact.     Conjunctiva/sclera: Conjunctivae normal.  Cardiovascular:     Rate and Rhythm: Normal rate and regular rhythm.     Pulses: Normal pulses.     Heart sounds: Normal heart sounds.  Pulmonary:     Effort: Pulmonary effort is normal.     Breath sounds: Normal breath sounds.  Abdominal:     General: Abdomen is flat. Bowel sounds are normal. There is no distension.     Palpations: Abdomen is soft.     Tenderness: There is no abdominal tenderness. There is no right CVA tenderness or left CVA tenderness.  Musculoskeletal:     Cervical back: Normal.     Thoracic back: Normal.     Lumbar back: Normal.   Skin:    General: Skin is warm and dry.  Neurological:     General: No focal deficit present.     Mental Status: She is alert and oriented to person, place, and time. Mental status is at baseline.  Psychiatric:        Mood and Affect: Mood normal.        Behavior: Behavior normal.        Thought Content: Thought content normal.        Judgment: Judgment normal.     UC Treatments / Results  Labs (all labs ordered are listed, but only abnormal results are displayed) Labs Reviewed  POCT URINALYSIS DIP (MANUAL ENTRY) - Abnormal; Notable for the following components:      Result Value   Leukocytes, UA Trace (*)    All other components within normal limits  URINE CULTURE    EKG   Radiology No results found.  Procedures Procedures (including critical care time)  Medications Ordered in UC Medications - No data to display  Initial Impression / Assessment and Plan / UC Course  I have reviewed the triage vital signs and the nursing notes.  Pertinent labs & imaging results that were available during my care of the patient were reviewed by me and considered in my medical decision making (see chart for details).     Urinalysis showing trace leukocytes, so will treat with Macrobid x7 days.  Urine culture is pending.  Patient to increase water intake.  Advised patient to go to the hospital if symptoms worsen.  Patient is nontoxic-appearing and does not appear to be in need of immediate medical attention at hospital at this time.  Discussed strict return precautions. Patient verbalized understanding and is agreeable with plan.  Patient declined interpreter and wished for daughter to interpret during visit. Final Clinical Impressions(s) / UC Diagnoses   Final diagnoses:  Dysuria  Acute cystitis without hematuria  Urinary frequency     Discharge Instructions      You have been prescribed Macrobid antibiotic to treat urinary tract infection.  Urine culture is pending.  We will  call if this is positive.  Please increase water intake.  Go to the hospital if symptoms worsen.     ED Prescriptions     Medication Sig Dispense Auth. Provider   nitrofurantoin, macrocrystal-monohydrate, (MACROBID) 100 MG capsule Take 1 capsule (  100 mg total) by mouth 2 (two) times daily for 7 days. 14 capsule Lance Muss, FNP      PDMP not reviewed this encounter.   Lance Muss, FNP 05/02/21 1115

## 2021-05-02 NOTE — Discharge Instructions (Addendum)
You have been prescribed Macrobid antibiotic to treat urinary tract infection.  Urine culture is pending.  We will call if this is positive.  Please increase water intake.  Go to the hospital if symptoms worsen.

## 2021-05-03 LAB — URINE CULTURE

## 2024-08-06 ENCOUNTER — Ambulatory Visit
Admission: EM | Admit: 2024-08-06 | Discharge: 2024-08-06 | Disposition: A | Discharge status: Home / Self Care | Attending: Family Medicine | Admitting: Family Medicine

## 2024-08-06 DIAGNOSIS — N390 Urinary tract infection, site not specified: Secondary | ICD-10-CM | POA: Diagnosis not present

## 2024-08-06 LAB — POCT URINE DIPSTICK
Bilirubin, UA: NEGATIVE
Glucose, UA: NEGATIVE mg/dL
Ketones, POC UA: NEGATIVE mg/dL
Nitrite, UA: NEGATIVE
Protein Ur, POC: NEGATIVE mg/dL
Spec Grav, UA: 1.015
Urobilinogen, UA: 0.2 U/dL
pH, UA: 6.5

## 2024-08-06 MED ORDER — CEPHALEXIN 500 MG PO CAPS: 500.0000 mg | ORAL_CAPSULE | Freq: Two times a day (BID) | ORAL | 0 refills | Status: AC

## 2024-08-06 NOTE — ED Provider Notes (Signed)
 " [facility]  Note:  This document was prepared using Conservation officer, historic buildings and may include unintentional dictation errors.  MRN: [id number] DOB: June 04, 1956  Subjective:   Patricia Petty is a 68 y.o. female presenting for 2-day history of low back pain, pelvic pain, nocturia. Denies fever, n/v, rashes, dysuria, urinary frequency, hematuria, vaginal discharge.  Has a history of UTIs and presents this way.  Hydrates with 2 bottles of water daily.  Drinks 1 cup of coffee in the morning.  Has also had a headache that resolved with Tylenol.  No confusion, weakness, numbness or tingling, chest pain, heart racing or palpitations.  No history of stroke, neurologic disorders.  No renal stones.  Current Outpatient Medications  Medication Instructions   atorvastatin (LIPITOR) 40 MG tablet Take by mouth.   atorvastatin (LIPITOR) 40 mg, Oral, Daily   benzonatate  (TESSALON ) 100 mg, Oral, Every 8 hours   glipiZIDE (GLUCOTROL XL) 5 MG 24 hr tablet 1 tablet, Daily   losartan (COZAAR) 25 MG tablet Take by mouth.   losartan (COZAAR) 25 mg, Oral, Daily   metFORMIN (GLUCOPHAGE-XR) 1,000 mg, Daily   Ozempic (0.25 or 0.5 MG/DOSE) 0.5 mg, Weekly   spironolactone (ALDACTONE) 50 mg, Oral, Daily    Allergies[1]  Past Medical History:  Diagnosis Date   Diabetes mellitus without complication (HCC)    Hypertension      History reviewed. No pertinent surgical history.  History reviewed. No pertinent family history.  Social History   Occupational History   Not on file  Tobacco Use   Smoking status: Never   Smokeless tobacco: Never  Vaping Use   Vaping status: Never Used  Substance and Sexual Activity   Alcohol use: Never   Drug use: Never   Sexual activity: Not on file     ROS   Objective:   Vitals: BP (!) 144/71 (BP Location: Left Arm)   Pulse 70   Temp 97.9 F (36.6 C) (Oral)   Resp 18   SpO2 96%   Physical Exam Constitutional:      General: She is  not in acute distress.    Appearance: Normal appearance. She is well-developed. She is not ill-appearing, toxic-appearing or diaphoretic.  HENT:     Head: Normocephalic and atraumatic.     Nose: Nose normal.     Mouth/Throat:     Mouth: Mucous membranes are moist.  Eyes:     General: No scleral icterus.       Right eye: No discharge.        Left eye: No discharge.     Extraocular Movements: Extraocular movements intact.     Conjunctiva/sclera: Conjunctivae normal.  Cardiovascular:     Rate and Rhythm: Normal rate.  Pulmonary:     Effort: Pulmonary effort is normal.  Abdominal:     General: Bowel sounds are normal. There is no distension.     Palpations: Abdomen is soft. There is no mass.     Tenderness: There is no abdominal tenderness. There is no right CVA tenderness, left CVA tenderness, guarding or rebound.  Skin:    General: Skin is warm and dry.  Neurological:     General: No focal deficit present.     Mental Status: She is alert and oriented to person, place, and time.     Cranial Nerves: No cranial nerve deficit.     Motor: No weakness.     Coordination: Coordination normal.     Gait: Gait  normal.  Psychiatric:        Mood and Affect: Mood normal.        Behavior: Behavior normal.        Thought Content: Thought content normal.        Judgment: Judgment normal.     Results for orders placed or performed during the hospital encounter of 08/06/24 (from the past 24 hours)  POCT URINE DIPSTICK     Status: Abnormal   Collection Time: 08/06/24  4:09 PM  Result Value Ref Range   Color, UA yellow yellow   Clarity, UA clear clear   Glucose, UA negative negative mg/dL   Bilirubin, UA negative negative   Ketones, POC UA negative negative mg/dL   Spec Grav, UA 8.984 8.989 - 1.025   Blood, UA trace-intact (A) negative   pH, UA 6.5 5.0 - 8.0   Protein Ur, POC negative negative mg/dL   Urobilinogen, UA 0.2 0.2 or 1.0 E.U./dL   Nitrite, UA Negative Negative   Leukocytes,  UA Trace (A) Negative    Assessment and Plan :   PDMP not reviewed this encounter.  1. Recurrent UTI      Start cephalexin  to cover for recurrent acute cystitis, urine culture pending.  Recommended consistent hydration, limiting urinary irritants. Counseled patient on potential for adverse effects with medications prescribed/recommended today, ER and return-to-clinic precautions discussed, patient verbalized understanding.     [1] No Known Allergies    Christopher Savannah, PA-C 08/06/24 1619  "

## 2024-08-06 NOTE — Discharge Instructions (Signed)
 Please start cephalexin  to address an urinary tract infection. Make sure you hydrate very well with plain water and a quantity of 80 ounces of water a day.  Please limit drinks that are considered urinary irritants such as fruit juices, soda, sweet tea, coffee, artifical sweetened drinks, energy drinks, alcohol.  These can worsen your urinary and genital symptoms but also be the source of them.  I will let you know about your urine culture results through MyChart to see if we need to prescribe or change your antibiotics based off of those results.

## 2024-08-06 NOTE — ED Triage Notes (Addendum)
 Pt's daughter provides history. Patient has a hx of recurrent UTI's and is presenting with c/o lower abdominal and back pain x 2 days. Pt denies any pain, burning, blood, or irritation with urination. Pt does report having to urinate 4-5 times throughout the night. Patient also reports headache. Patient did take Tylenol this morning, helped with headache a little bit. Pt denies any vision changes, nausea, or vomiting. Patient does report some dizziness.
